# Patient Record
Sex: Male | Born: 1990 | Race: Black or African American | Hispanic: No | Marital: Single | Smoking: Former smoker
Health system: Southern US, Community
[De-identification: ages and names within clinical notes are randomized; demographics above are authoritative.]

---

## 2012-11-01 ENCOUNTER — Ambulatory Visit (INDEPENDENT_AMBULATORY_CARE_PROVIDER_SITE_OTHER): Payer: BC Managed Care – PPO | Admitting: Emergency Medicine

## 2012-11-01 VITALS — BP 122/74 | HR 60 | Temp 98.0°F | Resp 18 | Ht 70.0 in | Wt 197.0 lb

## 2012-11-01 DIAGNOSIS — S335XXA Sprain of ligaments of lumbar spine, initial encounter: Secondary | ICD-10-CM

## 2012-11-01 MED ORDER — HYDROCODONE-ACETAMINOPHEN 5-325 MG PO TABS: 1.0000 | ORAL_TABLET | ORAL | Status: DC | PRN

## 2012-11-01 MED ORDER — NAPROXEN 500 MG PO TABS: 500.0000 mg | ORAL_TABLET | Freq: Two times a day (BID) | ORAL | Status: DC

## 2012-11-01 MED ORDER — CYCLOBENZAPRINE HCL 10 MG PO TABS: 10.0000 mg | ORAL_TABLET | Freq: Three times a day (TID) | ORAL | Status: DC | PRN

## 2012-11-01 NOTE — Patient Instructions (Signed)
Back Pain, Adult Low back pain is very common. About 1 in 5 people have back pain.The cause of low back pain is rarely dangerous. The pain often gets better over time.About half of people with a sudden onset of back pain feel better in just 2 weeks. About 8 in 10 people feel better by 6 weeks.  CAUSES Some common causes of back pain include:  Strain of the muscles or ligaments supporting the spine.  Wear and tear (degeneration) of the spinal discs.  Arthritis.  Direct injury to the back. DIAGNOSIS Most of the time, the direct cause of low back pain is not known.However, back pain can be treated effectively even when the exact cause of the pain is unknown.Answering your caregiver's questions about your overall health and symptoms is one of the most accurate ways to make sure the cause of your pain is not dangerous. If your caregiver needs more information, he or she may order lab work or imaging tests (X-rays or MRIs).However, even if imaging tests show changes in your back, this usually does not require surgery. HOME CARE INSTRUCTIONS For many people, back pain returns.Since low back pain is rarely dangerous, it is often a condition that people can learn to [facility] their own.   Remain active. It is stressful on the back to sit or stand in one place. Do not sit, drive, or stand in one place for more than 30 minutes at a time. Take short walks on level surfaces as soon as pain allows.Try to increase the length of time you walk each day.  Do not stay in bed.Resting more than 1 or 2 days can delay your recovery.  Do not avoid exercise or work.Your body is made to move.It is not dangerous to be active, even though your back may hurt.Your back will likely heal faster if you return to being active before your pain is gone.  Pay attention to your body when you bend and lift. Many people have less discomfortwhen lifting if they bend their knees, keep the load close to their bodies,and  avoid twisting. Often, the most comfortable positions are those that put less stress on your recovering back.  Find a comfortable position to sleep. Use a firm mattress and lie on your side with your knees slightly bent. If you lie on your back, put a pillow under your knees.  Only take over-the-counter or prescription medicines as directed by your caregiver. Over-the-counter medicines to reduce pain and inflammation are often the most helpful.Your caregiver may prescribe muscle relaxant drugs.These medicines help dull your pain so you can more quickly return to your normal activities and healthy exercise.  Put ice on the injured area.  Put ice in a plastic bag.  Place a towel between your skin and the bag.  Leave the ice on for 15-20 minutes, 3-4 times a day for the first 2 to 3 days. After that, ice and heat may be alternated to reduce pain and spasms.  Ask your caregiver about trying back exercises and gentle massage. This may be of some benefit.  Avoid feeling anxious or stressed.Stress increases muscle tension and can worsen back pain.It is important to recognize when you are anxious or stressed and learn ways to manage it.Exercise is a great option. SEEK MEDICAL CARE IF:  You have pain that is not relieved with rest or medicine.  You have pain that does not improve in 1 week.  You have new symptoms.  You are generally not feeling well. SEEK  IMMEDIATE MEDICAL CARE IF:   You have pain that radiates from your back into your legs.  You develop new bowel or bladder control problems.  You have unusual weakness or numbness in your arms or legs.  You develop nausea or vomiting.  You develop abdominal pain.  You feel faint. Document Released: 04/03/2005 Document Revised: 10/03/2011 Document Reviewed: 08/22/2010 [facility] Patient Information 2014 [place], [place].  Thank you for enrolling in MyChart. Please follow the instructions below to securely access your online medical  record. MyChart allows you to send messages to your doctor, view your test results, manage appointments, and more.   How Do I Sign Up? 1. In your Internet browser, go to [employer] and enter [email or web address]. 2. Click on the Sign Up Now link in the Sign In box. You will see the New Member Sign Up page. 3. Enter your MyChart Access Code exactly as it appears below. You will not need to use this code after you've completed the sign-up process. If you do not sign up before the expiration date, you must request a new code. MyChart Access Code: [email or web address] Expires: 12/01/2012  3:09 PM  4. Enter your Social Security Number ([email or web address]) and Date of Birth (mm/dd/yyyy) as indicated and click Submit. You will be taken to the next sign-up page. 5. Create a MyChart ID. This will be your MyChart login ID and cannot be changed, so think of one that is secure and easy to remember. 6. Create a MyChart password. You can change your password at any time. 7. Enter your Password Reset Question and Answer. This can be used at a later time if you forget your password.  8. Enter your e-mail address. You will receive e-mail notification when new information is available in MyChart. 9. Click Sign Up. You can now view your medical record.   Additional Information Remember, MyChart is NOT to be used for urgent needs. For medical emergencies, dial 911.

## 2012-11-01 NOTE — Progress Notes (Signed)
  Subjective:    Tim Powell is a 22 y.o. male who presents for evaluation of low back pain. The patient has had no prior back problems. Symptoms have been present for several week and are stable.  Onset was related to / precipitated by a twisting movement. The pain is located in the across the lower back and does not radiate. The pain is described as aching, burning, and soreness and occurs intermittently. He rates his pain as moderate. Symptoms are exacerbated by exercise, extension, flexion, lifting, straining and twisting. Symptoms are improved by NSAIDs and rest. He has also tried nothing which provided no symptom relief. He has no other symptoms associated with the back pain. The patient has no "red flag" history indicative of complicated back pain.  The following portions of the patient's history were reviewed and updated as appropriate: allergies, current medications, past family history, past medical history, past social history, past surgical history and problem list.  Review of Systems Pertinent items are noted in HPI.    Objective:   Full range of motion without pain, no tenderness, no spasm, no curvature. Normal reflexes, gait, strength and negative straight-leg raise.    Assessment:    Nonspecific acute low back pain    Plan:    Natural history and expected course discussed. Questions answered. Agricultural engineer distributed. Proper lifting, bending technique discussed. Heat to affected area as needed for local pain relief.

## 2012-11-01 NOTE — Progress Notes (Deleted)
[facility] [address], [place] [address] [phone]  Date:  11/01/2012   Name:  Tim Powell   DOB:  Jan 25, 1991   MRN:  [id number]  PCP:  No PCP Per Patient    Chief Complaint: Back Pain   History of Present Illness:  Tim Powell is a 22 y.o. very pleasant male patient who presents with the following:  ***  There are no active problems to display for this patient.   History reviewed. No pertinent past medical history.  History reviewed. No pertinent past surgical history.  History  Substance Use Topics  . Smoking status: Current Every Day Smoker  . Smokeless tobacco: Not on file  . Alcohol Use: Yes    History reviewed. No pertinent family history.  No Known Allergies  Medication list has been reviewed and updated.  No current outpatient prescriptions on file prior to visit.   No current facility-administered medications on file prior to visit.    Review of Systems:  ***  Physical Examination: Filed Vitals:   11/01/12 1456  BP: 122/74  Pulse: 60  Temp: 98 F (36.7 C)  Resp: 18   Filed Vitals:   11/01/12 1456  Height: 5\' 10"  (1.778 m)  Weight: 197 lb (89.359 kg)   Body mass index is 28.27 kg/(m^2). Ideal Body Weight: Weight in (lb) to have BMI = 25: 173.9  ***  Assessment and Plan: *** Signed,  Phillips Odor, MD

## 2014-09-04 ENCOUNTER — Ambulatory Visit (INDEPENDENT_AMBULATORY_CARE_PROVIDER_SITE_OTHER): Payer: BLUE CROSS/BLUE SHIELD | Admitting: Physician Assistant

## 2014-09-04 ENCOUNTER — Ambulatory Visit (INDEPENDENT_AMBULATORY_CARE_PROVIDER_SITE_OTHER): Payer: BLUE CROSS/BLUE SHIELD

## 2014-09-04 VITALS — BP 108/74 | HR 53 | Temp 98.5°F | Resp 14 | Ht 71.0 in | Wt 206.0 lb

## 2014-09-04 DIAGNOSIS — M25511 Pain in right shoulder: Secondary | ICD-10-CM

## 2014-09-04 MED ORDER — MELOXICAM 15 MG PO TABS: 15.0000 mg | ORAL_TABLET | Freq: Every day | ORAL | Status: DC

## 2014-09-04 NOTE — Progress Notes (Signed)
[facility] [address], [place] [id number] [phone]  Date:  09/04/2014   Name:  Tim FurryDarryl E Wiseman Jr.   DOB:  07/16/1990   MRN:  [id number]  PCP:  No PCP Per Patient    Chief Complaint: Shoulder Pain   History of Present Illness:  Tim E Erskine SquibbHill Jr. is a 24 y.o. very pleasant male patient who presents with the following:  More than 10 days ago, he woke up with right shoulder pain.  He thought he slept on it funny.  He worked Monday through Thursday with progressively worsened pain.  He woke up with soreness along the posterior side of his shoulder.  When he internally rotates, flexes, or pushes a big door.  It is sore like a muscle ache with exercise.  He has tried ice and warm compresses, which helps.  First gets out of shower it feels better.  Worse first thing in the morning.  No numbness or tingling in the extremity.  He has some pain with the wrist with pushing off the bed.    Vacationing 3 weeks ago, there was no pain.  He came back the first day of May.    There are no active problems to display for this patient.   History reviewed. No pertinent past medical history.  History reviewed. No pertinent past surgical history.  History  Substance Use Topics  . Smoking status: Former Games developermoker  . Smokeless tobacco: Not on file  . Alcohol Use: 0.0 oz/week    0 Standard drinks or equivalent per week    Family History  Problem Relation Age of Onset  . Hypertension Mother     No Known Allergies  Medication list has been reviewed and updated.  No current outpatient prescriptions on file prior to visit.   No current facility-administered medications on file prior to visit.    Review of Systems: ROS otherwise unremarkable unless listed above.  Physical Examination: Filed Vitals:   09/04/14 1321  BP: 108/74  Pulse: 53  Temp: 98.5 F (36.9 C)  Resp: 14   Filed Vitals:   09/04/14 1321  Height: 5\' 11"  (1.803 m)  Weight: 206 lb (93.441 kg)    Body mass index is 28.74 kg/(m^2). Ideal Body Weight: Weight in (lb) to have BMI = 25: 178.9  Physical Exam  Constitutional: He is oriented to person, place, and time. He appears well-developed and well-nourished.  HENT:  Head: Normocephalic and atraumatic.  Eyes: Pupils are equal, round, and reactive to light.  Cardiovascular: Normal rate, regular rhythm and normal heart sounds.   Pulmonary/Chest: Effort normal and breath sounds normal. No respiratory distress. He has no wheezes.  Musculoskeletal:  No swelling, erythema,  -Pain with internal rotation.  Lift off (subscapularis) pain, but with normal strength throughout.  Negative Neer.  Negative Speed and Yergason. +'Obrien -Apprehension -Clunk, but does have discomfort with axial loading, with arm abducted and extended.   -Tinel and -Phalen  Neurological: He is alert and oriented to person, place, and time.  Skin: Skin is warm and dry.  Psychiatric: He has a normal mood and affect. His behavior is normal.   [facility] reading (PRIMARY) by  Dr. Neva SeatGreene: No acute findings   Assessment and Plan: 24 year old male is here today for chief complaint of right shoulder pain.   Possible rotator cuff or labrum etiology.  I have discussed Mobic without other NSAID use.  Ice, and rest for several days.  He agrees to  at least 5 days of rest.  Then start of easy stretches to do daily.  If pain does not improve after 10 days, will refer to ortho.    Right shoulder pain - Plan: DG Shoulder Right, meloxicam (MOBIC) 15 MG tablet  Trena PlattStephanie English, PA-C [facility] 5/23/20167:53 AM

## 2014-09-04 NOTE — Patient Instructions (Addendum)
Please use ice for this shoulder as you have been.  Do this 3-4 times per day for 15 minutes, making surrre there is a protective barrier between you and the ice. Take the mobic and refrain from using ibuprofen, naproxen, or other NSAIDs.  You may add tylenol if needed.  Please rest it for the next week, or at least 5 days.  Please choose 3 light exercises to do daily below.  If it asks, for repetition do it 8 times.  If there is a hold, hold for 10 seconds.  If the pain does not improve in 10 days, please contact me.  EXERCISES  RANGE OF MOTION (ROM) AND STRETCHING EXERCISES - Impingement Syndrome (Rotator Cuff  Tendinitis, Bursitis) These exercises may help you when beginning to rehabilitate your injury. Your symptoms may go away with or without further involvement from your physician, physical therapist or athletic trainer. While completing these exercises, remember:   Restoring tissue flexibility helps normal motion to return to the joints. This allows healthier, less painful movement and activity.  An effective stretch should be held for at least 30 seconds.  A stretch should never be painful. You should only feel a gentle lengthening or release in the stretched tissue. STRETCH - Flexion, Standing  Stand with good posture. With an underhand grip on your right / left hand, and an overhand grip on the opposite hand, grasp a broomstick or cane so that your hands are a little more than shoulder width apart.  Keeping your right / left elbow straight and shoulder muscles relaxed, push the stick with your opposite hand, to raise your right / left arm in front of your body and then overhead. Raise your arm until you feel a stretch in your right / left shoulder, but before you have increased shoulder pain.  Try to avoid shrugging your right / left shoulder as your arm rises, by keeping your shoulder blade tucked down and toward your mid-back spine. Hold for __________ seconds.  Slowly return to the  starting position. Repeat __________ times. Complete this exercise __________ times per day. STRETCH - Abduction, Supine  Lie on your back. With an underhand grip on your right / left hand and an overhand grip on the opposite hand, grasp a broomstick or cane so that your hands are a little more than shoulder width apart.  Keeping your right / left elbow straight and your shoulder muscles relaxed, push the stick with your opposite hand, to raise your right / left arm out to the side of your body and then overhead. Raise your arm until you feel a stretch in your right / left shoulder, but before you have increased shoulder pain.  Try to avoid shrugging your right / left shoulder as your arm rises, by keeping your shoulder blade tucked down and toward your mid-back spine. Hold for __________ seconds.  Slowly return to the starting position. Repeat __________ times. Complete this exercise __________ times per day. ROM - Flexion, Active-Assisted  Lie on your back. You may bend your knees for comfort.  Grasp a broomstick or cane so your hands are about shoulder width apart. Your right / left hand should grip the end of the stick, so that your hand is positioned "thumbs-up," as if you were about to shake hands.  Using your healthy arm to lead, raise your right / left arm overhead, until you feel a gentle stretch in your shoulder. Hold for __________ seconds.  Use the stick to assist in returning  your right / left arm to its starting position. Repeat __________ times. Complete this exercise __________ times per day.  ROM - Internal Rotation, Supine   Lie on your back on a firm surface. Place your right / left elbow about 60 degrees away from your side. Elevate your elbow with a folded towel, so that the elbow and shoulder are the same height.  Using a broomstick or cane and your strong arm, pull your right / left hand toward your body until you feel a gentle stretch, but no increase in your  shoulder pain. Keep your shoulder and elbow in place throughout the exercise.  Hold for __________ seconds. Slowly return to the starting position. Repeat __________ times. Complete this exercise __________ times per day. STRETCH - Internal Rotation  Place your right / left hand behind your back, palm up.  Throw a towel or belt over your opposite shoulder. Grasp the towel with your right / left hand.  While keeping an upright posture, gently pull up on the towel, until you feel a stretch in the front of your right / left shoulder.  Avoid shrugging your right / left shoulder as your arm rises, by keeping your shoulder blade tucked down and toward your mid-back spine.  Hold for __________ seconds. Release the stretch, by lowering your healthy hand. Repeat __________ times. Complete this exercise __________ times per day. ROM - Internal Rotation   Using an underhand grip, grasp a stick behind your back with both hands.  While standing upright with good posture, slide the stick up your back until you feel a mild stretch in the front of your shoulder.  Hold for __________ seconds. Slowly return to your starting position. Repeat __________ times. Complete this exercise __________ times per day.  STRETCH - Posterior Shoulder Capsule   Stand or sit with good posture. Grasp your right / left elbow and draw it across your chest, keeping it at the same height as your shoulder.  Pull your elbow, so your upper arm comes in closer to your chest. Pull until you feel a gentle stretch in the back of your shoulder.  Hold for __________ seconds. Repeat __________ times. Complete this exercise __________ times per day. STRENGTHENING EXERCISES - Impingement Syndrome (Rotator Cuff Tendinitis, Bursitis) These exercises may help you when beginning to rehabilitate your injury. They may resolve your symptoms with or without further involvement from your physician, physical therapist or athletic trainer. While  completing these exercises, remember:  Muscles can gain both the endurance and the strength needed for everyday activities through controlled exercises.  Complete these exercises as instructed by your physician, physical therapist or athletic trainer. Increase the resistance and repetitions only as guided.  You may experience muscle soreness or fatigue, but the pain or discomfort you are trying to eliminate should never worsen during these exercises. If this pain does get worse, stop and make sure you are following the directions exactly. If the pain is still present after adjustments, discontinue the exercise until you can discuss the trouble with your clinician.  During your recovery, avoid activity or exercises which involve actions that place your injured hand or elbow above your head or behind your back or head. These positions stress the tissues which you are trying to heal. STRENGTH - Scapular Depression and Adduction   With good posture, sit on a firm chair. Support your arms in front of you, with pillows, arm rests, or on a table top. Have your elbows in line with the sides  of your body.  Gently draw your shoulder blades down and toward your mid-back spine. Gradually increase the tension, without tensing the muscles along the top of your shoulders and the back of your neck.  Hold for __________ seconds. Slowly release the tension and relax your muscles completely before starting the next repetition.  After you have practiced this exercise, remove the arm support and complete the exercise in standing as well as sitting position. Repeat __________ times. Complete this exercise __________ times per day.  STRENGTH - Shoulder Abductors, Isometric  With good posture, stand or sit about 4-6 inches from a wall, with your right / left side facing the wall.  Bend your right / left elbow. Gently press your right / left elbow into the wall. Increase the pressure gradually, until you are pressing  as hard as you can, without shrugging your shoulder or increasing any shoulder discomfort.  Hold for __________ seconds.  Release the tension slowly. Relax your shoulder muscles completely before you begin the next repetition. Repeat __________ times. Complete this exercise __________ times per day.  STRENGTH - External Rotators, Isometric  Keep your right / left elbow at your side and bend it 90 degrees.  Step into a door frame so that the outside of your right / left wrist can press against the door frame without your upper arm leaving your side.  Gently press your right / left wrist into the door frame, as if you were trying to swing the back of your hand away from your stomach. Gradually increase the tension, until you are pressing as hard as you can, without shrugging your shoulder or increasing any shoulder discomfort.  Hold for __________ seconds.  Release the tension slowly. Relax your shoulder muscles completely before you begin the next repetition. Repeat __________ times. Complete this exercise __________ times per day.  STRENGTH - Supraspinatus   Stand or sit with good posture. Grasp a __________ weight, or an exercise band or tubing, so that your hand is "thumbs-up," like you are shaking hands.  Slowly lift your right / left arm in a "V" away from your thigh, diagonally into the space between your side and straight ahead. Lift your hand to shoulder height or as far as you can, without increasing any shoulder pain. At first, many people do not lift their hands above shoulder height.  Avoid shrugging your right / left shoulder as your arm rises, by keeping your shoulder blade tucked down and toward your mid-back spine.  Hold for __________ seconds. Control the descent of your hand, as you slowly return to your starting position. Repeat __________ times. Complete this exercise __________ times per day.  STRENGTH - External Rotators  Secure a rubber exercise band or tubing to a  fixed object (table, pole) so that it is at the same height as your right / left elbow when you are standing or sitting on a firm surface.  Stand or sit so that the secured exercise band is at your uninjured side.  Bend your right / left elbow 90 degrees. Place a folded towel or small pillow under your right / left arm, so that your elbow is a few inches away from your side.  Keeping the tension on the exercise band, pull it away from your body, as if pivoting on your elbow. Be sure to keep your body steady, so that the movement is coming only from your rotating shoulder.  Hold for __________ seconds. Release the tension in a controlled manner, as  you return to the starting position. Repeat __________ times. Complete this exercise __________ times per day.  STRENGTH - Internal Rotators   Secure a rubber exercise band or tubing to a fixed object (table, pole) so that it is at the same height as your right / left elbow when you are standing or sitting on a firm surface.  Stand or sit so that the secured exercise band is at your right / left side.  Bend your elbow 90 degrees. Place a folded towel or small pillow under your right / left arm so that your elbow is a few inches away from your side.  Keeping the tension on the exercise band, pull it across your body, toward your stomach. Be sure to keep your body steady, so that the movement is coming only from your rotating shoulder.  Hold for __________ seconds. Release the tension in a controlled manner, as you return to the starting position. Repeat __________ times. Complete this exercise __________ times per day.  STRENGTH - Scapular Protractors, Standing   Stand arms length away from a wall. Place your hands on the wall, keeping your elbows straight.  Begin by dropping your shoulder blades down and toward your mid-back spine.  To strengthen your protractors, keep your shoulder blades down, but slide them forward on your rib cage. It will  feel as if you are lifting the back of your rib cage away from the wall. This is a subtle motion and can be challenging to complete. Ask your caregiver for further instruction, if you are not sure you are doing the exercise correctly.  Hold for __________ seconds. Slowly return to the starting position, resting the muscles completely before starting the next repetition. Repeat __________ times. Complete this exercise __________ times per day. STRENGTH - Scapular Protractors, Supine  Lie on your back on a firm surface. Extend your right / left arm straight into the air while holding a __________ weight in your hand.  Keeping your head and back in place, lift your shoulder off the floor.  Hold for __________ seconds. Slowly return to the starting position, and allow your muscles to relax completely before starting the next repetition. Repeat __________ times. Complete this exercise __________ times per day. STRENGTH - Scapular Protractors, Quadruped  Get onto your hands and knees, with your shoulders directly over your hands (or as close as you can be, comfortably).  Keeping your elbows locked, lift the back of your rib cage up into your shoulder blades, so your mid-back rounds out. Keep your neck muscles relaxed.  Hold this position for __________ seconds. Slowly return to the starting position and allow your muscles to relax completely before starting the next repetition. Repeat __________ times. Complete this exercise __________ times per day.  STRENGTH - Scapular Retractors  Secure a rubber exercise band or tubing to a fixed object (table, pole), so that it is at the height of your shoulders when you are either standing, or sitting on a firm armless chair.  With a palm down grip, grasp an end of the band in each hand. Straighten your elbows and lift your hands straight in front of you, at shoulder height. Step back, away from the secured end of the band, until it becomes tense.  Squeezing  your shoulder blades together, draw your elbows back toward your sides, as you bend them. Keep your upper arms lifted away from your body throughout the exercise.  Hold for __________ seconds. Slowly ease the tension on the band, as  you reverse the directions and return to the starting position. Repeat __________ times. Complete this exercise __________ times per day. STRENGTH - Shoulder Extensors   Secure a rubber exercise band or tubing to a fixed object (table, pole) so that it is at the height of your shoulders when you are either standing, or sitting on a firm armless chair.  With a thumbs-up grip, grasp an end of the band in each hand. Straighten your elbows and lift your hands straight in front of you, at shoulder height. Step back, away from the secured end of the band, until it becomes tense.  Squeezing your shoulder blades together, pull your hands down to the sides of your thighs. Do not allow your hands to go behind you.  Hold for __________ seconds. Slowly ease the tension on the band, as you reverse the directions and return to the starting position. Repeat __________ times. Complete this exercise __________ times per day.  STRENGTH - Scapular Retractors and External Rotators   Secure a rubber exercise band or tubing to a fixed object (table, pole) so that it is at the height as your shoulders, when you are either standing, or sitting on a firm armless chair.  With a palm down grip, grasp an end of the band in each hand. Bend your elbows 90 degrees and lift your elbows to shoulder height, at your sides. Step back, away from the secured end of the band, until it becomes tense.  Squeezing your shoulder blades together, rotate your shoulders so that your upper arms and elbows remain stationary, but your fists travel upward to head height.  Hold for __________ seconds. Slowly ease the tension on the band, as you reverse the directions and return to the starting position. Repeat  __________ times. Complete this exercise __________ times per day.  STRENGTH - Scapular Retractors and External Rotators, Rowing   Secure a rubber exercise band or tubing to a fixed object (table, pole) so that it is at the height of your shoulders, when you are either standing, or sitting on a firm armless chair.  With a palm down grip, grasp an end of the band in each hand. Straighten your elbows and lift your hands straight in front of you, at shoulder height. Step back, away from the secured end of the band, until it becomes tense.  Step 1: Squeeze your shoulder blades together. Bending your elbows, draw your hands to your chest, as if you are rowing a boat. At the end of this motion, your hands and elbow should be at shoulder height and your elbows should be out to your sides.  Step 2: Rotate your shoulders, to raise your hands above your head. Your forearms should be vertical and your upper arms should be horizontal.  Hold for __________ seconds. Slowly ease the tension on the band, as you reverse the directions and return to the starting position. Repeat __________ times. Complete this exercise __________ times per day.  STRENGTH - Scapular Depressors  Find a sturdy chair without wheels, such as a dining room chair.  Keeping your feet on the floor, and your hands on the chair arms, lift your bottom up from the seat, and lock your elbows.  Keeping your elbows straight, allow gravity to pull your body weight down. Your shoulders will rise toward your ears.  Raise your body against gravity by drawing your shoulder blades down your back, shortening the distance between your shoulders and ears. Although your feet should always maintain contact with  the floor, your feet should progressively support less body weight, as you get stronger.  Hold for __________ seconds. In a controlled and slow manner, lower your body weight to begin the next repetition. Repeat __________ times. Complete this  exercise __________ times per day.  Document Released: 04/03/2005 Document Revised: 06/26/2011 Document Reviewed: 07/16/2008 [facility] Patient Information 2015 [place], [place]. This information is not intended to replace advice given to you by your health care provider. Make sure you discuss any questions you have with your health care provider.

## 2014-09-21 ENCOUNTER — Ambulatory Visit (INDEPENDENT_AMBULATORY_CARE_PROVIDER_SITE_OTHER): Payer: BLUE CROSS/BLUE SHIELD | Admitting: Physician Assistant

## 2014-09-21 VITALS — BP 132/80 | HR 61 | Temp 98.3°F | Resp 17 | Ht 71.0 in | Wt 205.0 lb

## 2014-09-21 DIAGNOSIS — M25511 Pain in right shoulder: Secondary | ICD-10-CM

## 2014-09-21 NOTE — Patient Instructions (Signed)
I have filled out your paperwork to bring to your employer, or whomever.  Please await phone call for referral to ortho appointment. Continue take an anti-inflammatory, ice, and do the light stretches.

## 2014-09-28 ENCOUNTER — Encounter: Payer: Self-pay | Admitting: Physician Assistant

## 2014-09-28 NOTE — Progress Notes (Signed)
[facility] [address] [place] [address] [phone]  Date:  09/21/2014   Name:  Tim Powell.   DOB:  22-Mar-1991   MRN:  [id number]  PCP:  No PCP Per Patient    History of Present Illness:  Tim E Majestic Spessard. is a 24 y.o. male patient who present to [facility] for followup of right shoulder injury and to have paperwork filled out.  He is doing much better, he reports.  He states that he has been off work since 08/31/2014.  He is compliantly doing stretches every day, ice, and medications.  He has not taken the mobic lately.  He states that he continues to have some shoulder pain lifting from behind.  He has not tingling or paresthesias of the extremity.      There are no active problems to display for this patient.   History reviewed. No pertinent past medical history.  History reviewed. No pertinent past surgical history.  History  Substance Use Topics  . Smoking status: Former Games developer  . Smokeless tobacco: Not on file  . Alcohol Use: 0.0 oz/week    0 Standard drinks or equivalent per week    Family History  Problem Relation Age of Onset  . Hypertension Mother     No Known Allergies  Medication list has been reviewed and updated.  Current Outpatient Prescriptions on File Prior to Visit  Medication Sig Dispense Refill  . meloxicam (MOBIC) 15 MG tablet Take 1 tablet (15 mg total) by mouth daily. (Patient not taking: Reported on 09/21/2014) 30 tablet 1   No current facility-administered medications on file prior to visit.    ROS   Physical Examination: BP 132/80 mmHg  Pulse 61  Temp(Src) 98.3 F (36.8 C) (Oral)  Resp 17  Ht 5\' 11"  (1.803 m)  Wt 205 lb (92.987 kg)  BMI 28.60 kg/m2  SpO2 99% Ideal Body Weight: Weight in (lb) to have BMI = 25: 178.9  Physical Exam  Constitutional: He appears well-developed and well-nourished.  Cardiovascular: Normal rate, regular rhythm and normal heart sounds.   Pulmonary/Chest: Effort normal and breath  sounds normal. No respiratory distress. He has no wheezes.  Musculoskeletal:       Right shoulder: He exhibits pain (With internal rotation and vertical extension). He exhibits normal range of motion, no bony tenderness, no swelling, no spasm, normal pulse and normal strength.     Assessment and Plan: 24 year old male is here today for follow up and paperwork for right shoulder injury.  This is improving.  I have filled the paperwork to allow time for injury resolve.  Will also place referral to orthopedics.  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  Estimated rt to work for 10/16/2014.    1. Right shoulder pain - AMB referral to orthopedics   Trena Platt, PA-C [facility] 09/28/2014 8:58 AM

## 2014-10-22 ENCOUNTER — Telehealth: Payer: Self-pay | Admitting: Physician Assistant

## 2014-10-22 NOTE — Telephone Encounter (Signed)
Assessment and Plan: 24 year old male is here today for follow up and paperwork for right shoulder injury. This is improving. I have filled the paperwork to allow time for injury resolve. Will also place referral to orthopedics. 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 Estimated rt to work for 10/16/2014.   It looks like Judeth CornfieldStephanie already filled out this information as far as RTW. Called pt, he needs a note to go back to work. He states he is feeling better. Should he RTC?

## 2014-10-22 NOTE — Telephone Encounter (Signed)
Tim Powell, can tell him to contact ortho to his disability forms too?

## 2014-10-22 NOTE — Telephone Encounter (Signed)
He really should get a recheck before return to work. Looks like he was referred to ortho at last visit. They are now in charge of this - he needs to contact them to get a RTW note.

## 2014-10-22 NOTE — Telephone Encounter (Signed)
Patient states that he needs a note to return to work. He was last seen on 09/21/2014. He also states that he has some disability paperwork that needs to be completed. Explained out policy on that. Please advise patient if we are able to do his RTW note or if he needs a recheck first.  [phone]

## 2014-10-22 NOTE — Telephone Encounter (Signed)
Spoke with pt, he stated he was never called for a referral to ortho so he did not go. I just advised him to RTC to sort all of this out.

## 2014-11-05 DIAGNOSIS — Z0271 Encounter for disability determination: Secondary | ICD-10-CM

## 2014-11-05 NOTE — Telephone Encounter (Signed)
Paperwork completed and put in drop box at 102, called patient to him know it was ready for pick up.

## 2014-11-25 ENCOUNTER — Other Ambulatory Visit: Payer: Self-pay | Admitting: Family Medicine

## 2014-11-25 DIAGNOSIS — M25511 Pain in right shoulder: Secondary | ICD-10-CM

## 2014-11-29 ENCOUNTER — Other Ambulatory Visit: Payer: Self-pay

## 2018-01-01 ENCOUNTER — Other Ambulatory Visit: Payer: Self-pay

## 2018-01-01 ENCOUNTER — Encounter: Payer: Self-pay | Admitting: Urgent Care

## 2018-01-01 ENCOUNTER — Ambulatory Visit (INDEPENDENT_AMBULATORY_CARE_PROVIDER_SITE_OTHER): Payer: BLUE CROSS/BLUE SHIELD

## 2018-01-01 ENCOUNTER — Ambulatory Visit: Payer: BLUE CROSS/BLUE SHIELD | Admitting: Urgent Care

## 2018-01-01 VITALS — BP 127/82 | HR 58 | Temp 98.4°F | Ht 71.5 in | Wt 219.6 lb

## 2018-01-01 DIAGNOSIS — M25511 Pain in right shoulder: Secondary | ICD-10-CM | POA: Diagnosis not present

## 2018-01-01 MED ORDER — PREDNISONE 20 MG PO TABS: ORAL_TABLET | ORAL | 0 refills | Status: DC

## 2018-01-01 MED ORDER — MELOXICAM 7.5 MG PO TABS: 15.0000 mg | ORAL_TABLET | Freq: Every day | ORAL | 1 refills | Status: DC

## 2018-01-01 MED ORDER — CYCLOBENZAPRINE HCL 5 MG PO TABS: 5.0000 mg | ORAL_TABLET | Freq: Every evening | ORAL | 1 refills | Status: DC | PRN

## 2018-01-01 NOTE — Progress Notes (Signed)
    MRN: [id number] DOB: 03/16/1991  Subjective:   Tim E Erskine SquibbHill Jr. is a 27 y.o. male presenting for 1 week history of right shoulder pain. His pain is intermittent, dull/throbbing, occurs randomly, lasts about an hour. Patient has strenuous work, does a lot of lifting, extending of arms. Patient was previously seen for this in May and June 2016 and was written out of work indefinitely at his request.  He was supposed to be seen by specialist, he did end up going and was supposed to have an MRI the patient refused this due to feelings of claustrophobia.  Denies any particular trauma, falls, sport injury, bruise, redness, swelling, warmth. Has tried Bayer aspirin with minimal relief.  reports that he has quit smoking. He has never used smokeless tobacco. He reports that he drinks alcohol. He reports that he has current or past drug history. Drug: Marijuana.   Mitsuo has a current medication list which includes the following prescription(s): meloxicam. Also has No Known Allergies.  Tim Powell  has no past medical history on file. Also  has no past surgical history on file.  Objective:   Vitals: BP 127/82 (BP Location: Right Arm, Patient Position: Sitting, Cuff Size: Normal)   Pulse (!) 58   Temp 98.4 F (36.9 C) (Oral)   Ht 5' 11.5" (1.816 m)   Wt 219 lb 9.6 oz (99.6 kg)   SpO2 100%   BMI 30.20 kg/m   Physical Exam  Musculoskeletal:       Right shoulder: He exhibits decreased range of motion (external rotation) and tenderness (during ROM testing, over anterior-lateral deltoids). He exhibits no bony tenderness, no swelling, no effusion, no crepitus, no laceration, no spasm and normal strength.  Negative Hawkins, Neer and empty beer can test.   Dg Shoulder Right  Result Date: 01/01/2018 CLINICAL DATA:  Acute pain right shoulder. EXAM: RIGHT SHOULDER - 2+ VIEW COMPARISON:  No recent. FINDINGS: No acute bony or joint abnormality identified. No evidence of fracture or dislocation. IMPRESSION: No  acute abnormality. Electronically Signed   By: Maisie Fushomas  Register   On: 01/01/2018 15:28     Assessment and Plan :   Acute pain of right shoulder - Plan: Ambulatory referral to Orthopedic Surgery, DG Shoulder Right  Right shoulder pain - Plan: meloxicam (MOBIC) 7.5 MG tablet, DG Shoulder Right  I refused to write patient out of work indefinitely given that his physical exam findings were fairly benign.  I did write patient for light duty work restrictions.  Referral to orthopedics is pending.  In the meantime patient is to use short steroid course, muscle relaxant.  Once he is done with prednisone, patient is to use meloxicam. Counseled patient on potential for adverse effects with medications prescribed today, patient verbalized understanding. Return-to-clinic precautions discussed, patient verbalized understanding.   Tim BambergMario Jenetta Wease, PA-C Primary Care at [facility] [phone] 01/01/2018  2:51 PM

## 2018-01-01 NOTE — Patient Instructions (Addendum)
Shoulder Pain Many things can cause shoulder pain, including:  An injury to the area.  Overuse of the shoulder.  Arthritis.  The source of the pain can be:  Inflammation.  An injury to the shoulder joint.  An injury to a tendon, ligament, or bone.  Follow these instructions at home: Take these actions to help with your pain:  Squeeze a soft ball or a foam pad as much as possible. This helps to keep the shoulder from swelling. It also helps to strengthen the arm.  Take over-the-counter and prescription medicines only as told by your health care provider.  If directed, apply ice to the area: ? Put ice in a plastic bag. ? Place a towel between your skin and the bag. ? Leave the ice on for 20 minutes, 2-3 times per day. Stop applying ice if it does not help with the pain.  If you were given a shoulder sling or immobilizer: ? Wear it as told. ? Remove it to shower or bathe. ? Move your arm as little as possible, but keep your hand moving to prevent swelling.  Contact a health care provider if:  Your pain gets worse.  Your pain is not relieved with medicines.  New pain develops in your arm, hand, or fingers. Get help right away if:  Your arm, hand, or fingers: ? Tingle. ? Become numb. ? Become swollen. ? Become painful. ? Turn white or blue. This information is not intended to replace advice given to you by your health care provider. Make sure you discuss any questions you have with your health care provider. Document Released: 01/11/2005 Document Revised: 11/28/2015 Document Reviewed: 07/27/2014 Elsevier Interactive Patient Education  [employer]2018 Elsevier Inc.     If you have lab work done today you will be contacted with your lab results within the next 2 weeks.  If you have not heard from us then please contact us. The fastest way to get your results is to register for My Chart.   IF you received an x-ray today, you will receive an invoice from [facility].  Please contact [facility] at [phone] with questions or concerns regarding your invoice.   IF you received labwork today, you will receive an invoice from [facility]. Please contact [facility] at [phone] with questions or concerns regarding your invoice.   Our billing staff will not be able to assist you with questions regarding bills from these companies.  You will be contacted with the lab results as soon as they are available. The fastest way to get your results is to activate your My Chart account. Instructions are located on the last page of this paperwork. If you have not heard from us regarding the results in 2 weeks, please contact this office.

## 2018-01-08 ENCOUNTER — Ambulatory Visit (INDEPENDENT_AMBULATORY_CARE_PROVIDER_SITE_OTHER): Payer: BLUE CROSS/BLUE SHIELD | Admitting: Orthopaedic Surgery

## 2018-01-08 ENCOUNTER — Encounter (INDEPENDENT_AMBULATORY_CARE_PROVIDER_SITE_OTHER): Payer: Self-pay | Admitting: Orthopaedic Surgery

## 2018-01-08 DIAGNOSIS — M25511 Pain in right shoulder: Secondary | ICD-10-CM

## 2018-01-08 NOTE — Progress Notes (Signed)
   Office Visit Note   Patient: Tim FurryDarryl E Brew Jr.           Date of Birth: 09/16/1990           MRN: [id number] Visit Date: 01/08/2018              Requested by: Wallis BambergMani, Mario, PA-C [address] [place], [place] [id number] PCP: Patient, No Pcp Per   Assessment & Plan: Visit Diagnoses:  1. Acute pain of right shoulder     Plan: Impression is rotator cuff tendinitis.  Likely from overuse.  I recommend rest and work restrictions for 6 weeks.  Patient instructed to follow-up if he has no better after that time.  Overall patient is not in significant pain to warrant a cortisone injection.  Otherwise follow-up as needed.  Follow-Up Instructions: Return if symptoms worsen or fail to improve.   Orders:  No orders of the defined types were placed in this encounter.  No orders of the defined types were placed in this encounter.     Procedures: No procedures performed   Clinical Data: No additional findings.   Subjective: Chief Complaint  Patient presents with  . Right Shoulder - Pain    Tim Powell is a 27 year old gentleman with 2 to 3 weeks of right shoulder pain that has gotten worse.  He works at [employer] and he [employer] packages.  He lifts up to 70 pounds.  He denies any acute injury or pain or any popping.  He is right-hand dominant.  Denies any neck pain or radicular symptoms.  Rest helps.   Review of Systems  Constitutional: Negative.   All other systems reviewed and are negative.    Objective: Vital Signs: There were no vitals taken for this visit.  Physical Exam  Constitutional: He is oriented to person, place, and time. He appears well-developed and well-nourished.  HENT:  Head: Normocephalic and atraumatic.  Eyes: Pupils are equal, round, and reactive to light.  Neck: Neck supple.  Pulmonary/Chest: Effort normal.  Abdominal: Soft.  Musculoskeletal: Normal range of motion.  Neurological: He is alert and oriented to person, place, and time.  Skin: Skin is warm.  Psychiatric:  He has a normal mood and affect. His behavior is normal. Judgment and thought content normal.  Nursing note and vitals reviewed.   Ortho Exam Right shoulder exam shows full active and passive range of motion without pain.  Negative impingement signs.  He has minimal pain with empty can testing.  Negative crank test. Specialty Comments:  No specialty comments available.  Imaging: No results found.   PMFS History: There are no active problems to display for this patient.  No past medical history on file.  Family History  Problem Relation Age of Onset  . Hypertension Mother     No past surgical history on file. Social History   Occupational History  . Not on file  Tobacco Use  . Smoking status: Former Games developermoker  . Smokeless tobacco: Never Used  Substance and Sexual Activity  . Alcohol use: Yes    Alcohol/week: 0.0 standard drinks  . Drug use: Yes    Types: Marijuana  . Sexual activity: Not on file

## 2018-01-16 ENCOUNTER — Telehealth: Payer: Self-pay | Admitting: Family Medicine

## 2018-01-16 NOTE — Telephone Encounter (Signed)
Patient needs disability forms completed for when he saw [facility] for his right shoulder injury. I have completed the forms based off the OV notes and highlighted what needs to be completed. I will place the forms in Dr Creta Levin box on 01/16/18 to be signed since she is covering his box. Please return to the FMLA/Disability at the 102 checkout desk within 5-7 business days. Thank you!

## 2018-01-18 ENCOUNTER — Ambulatory Visit: Payer: BLUE CROSS/BLUE SHIELD | Admitting: Family Medicine

## 2018-01-22 ENCOUNTER — Telehealth: Payer: Self-pay

## 2018-01-22 NOTE — Telephone Encounter (Signed)
Pt picked his paperwork and came back stating he needed two things changed. 1-section 2, he states his disability was 9/11 and the paperwork says 9/10. 2-Actual return to work date we have 01/02/18, it should be (according to the work note) 9/24 + 6 weeks. I have placed in your box at 102. Please advise at [phone] when ready.

## 2018-01-22 NOTE — Telephone Encounter (Signed)
Spoke with pt via phone advised [employer] STD form completed and ready for pickup.  Pt agreeable.  Copy of form placed in scan box for scanning and I have copy with me just in case it is needed before it is scanned in Epic. Dgaddy, CMA

## 2018-01-23 NOTE — Telephone Encounter (Signed)
Out of work note that was brought in by patient was from a [place] [facility] putting him out of work from 6 weeks after 9-24 not from [place]. So we can only complete forms for what we took the patient out of work for. [facility] [facility] will have to complete forms for their office.

## 2018-02-19 ENCOUNTER — Telehealth (INDEPENDENT_AMBULATORY_CARE_PROVIDER_SITE_OTHER): Payer: Self-pay | Admitting: Orthopaedic Surgery

## 2018-02-19 NOTE — Telephone Encounter (Signed)
Returned call to patient got recording mailbox have not been set up yet    Could not leave message  [phone]

## 2018-03-01 ENCOUNTER — Encounter (INDEPENDENT_AMBULATORY_CARE_PROVIDER_SITE_OTHER): Payer: Self-pay | Admitting: Orthopaedic Surgery

## 2018-03-01 ENCOUNTER — Ambulatory Visit (INDEPENDENT_AMBULATORY_CARE_PROVIDER_SITE_OTHER): Payer: BLUE CROSS/BLUE SHIELD | Admitting: Orthopaedic Surgery

## 2018-03-01 ENCOUNTER — Other Ambulatory Visit (INDEPENDENT_AMBULATORY_CARE_PROVIDER_SITE_OTHER): Payer: Self-pay

## 2018-03-01 VITALS — Ht 71.0 in | Wt 219.0 lb

## 2018-03-01 DIAGNOSIS — M25511 Pain in right shoulder: Secondary | ICD-10-CM

## 2018-03-01 DIAGNOSIS — G8929 Other chronic pain: Secondary | ICD-10-CM | POA: Diagnosis not present

## 2018-03-01 MED ORDER — LIDOCAINE HCL 2 % IJ SOLN
2.0000 mL | INTRAMUSCULAR | Status: AC | PRN
  Administered 2018-03-01: 2 mL

## 2018-03-01 MED ORDER — METHYLPREDNISOLONE ACETATE 40 MG/ML IJ SUSP
40.0000 mg | INTRAMUSCULAR | Status: AC | PRN
  Administered 2018-03-01: 40 mg via INTRA_ARTICULAR

## 2018-03-01 MED ORDER — BUPIVACAINE HCL 0.25 % IJ SOLN
2.0000 mL | INTRAMUSCULAR | Status: AC | PRN
  Administered 2018-03-01: 2 mL via INTRA_ARTICULAR

## 2018-03-01 NOTE — Progress Notes (Signed)
   Office Visit Note   Patient: Tim FurryDarryl E Pope Jr.           Date of Birth: 11/20/1990           MRN: [id number] Visit Date: 03/01/2018              Requested by: No referring provider defined for this encounter. PCP: Patient, No Pcp Per   Assessment & Plan: Visit Diagnoses:  1. Chronic right shoulder pain     Plan: Impression is right shoulder subacromial bursitis rotator cuff tendinitis.  Today, we will inject the right subacromial space with cortisone.  I will also send him to formal physical therapy to regain strength.  He will follow-up with us in 2 weeks time for recheck and hopeful release back to work at that point.  Follow-Up Instructions: Return in about 2 weeks (around 03/15/2018).   Orders:  Orders Placed This Encounter  Procedures  . Large Joint Inj: R subacromial bursa   No orders of the defined types were placed in this encounter.     Procedures: Large Joint Inj: R subacromial bursa on 03/01/2018 11:07 AM Indications: pain Details: 22 G needle Medications: 2 mL lidocaine 2 %; 2 mL bupivacaine 0.25 %; 40 mg methylPREDNISolone acetate 40 MG/ML Outcome: tolerated well, no immediate complications Patient was prepped and draped in the usual sterile fashion.       Clinical Data: No additional findings.   Subjective: Chief Complaint  Patient presents with  . Right Shoulder - Pain    HPI patient is a pleasant 27 year old gentleman who presents to our clinic today for follow-up of his right shoulder pain.  This has been ongoing for a little over a month now.  The pain has somewhat improved with rest.  No previous physical therapy or cortisone injections.  He does note that he still has some limitations with internal rotation as well as if he were to have to lift something heavy overhead.  He does work for [employer] and is concerned that this will limit his ability to work.  Review of Systems as detailed in HPI.  All others reviewed and are  negative.   Objective: Vital Signs: Ht 5\' 11"  (1.803 m)   Wt 219 lb (99.3 kg)   BMI 30.54 kg/m   Physical Exam well-developed well-nourished gentleman in no acute distress.  Alert and oriented x3.  Ortho Exam examination of his right shoulder reveals full forward flexion and abduction.  He can internally rotate to L5 and this is with slight pain.  Minimally positive empty can.  He is neurovascularly intact distally.  Specialty Comments:  No specialty comments available.  Imaging: No new imaging   PMFS History: Patient Active Problem List   Diagnosis Date Noted  . Chronic right shoulder pain 03/01/2018   No past medical history on file.  Family History  Problem Relation Age of Onset  . Hypertension Mother     No past surgical history on file. Social History   Occupational History  . Not on file  Tobacco Use  . Smoking status: Former Games developermoker  . Smokeless tobacco: Never Used  Substance and Sexual Activity  . Alcohol use: Yes    Alcohol/week: 0.0 standard drinks  . Drug use: Yes    Types: Marijuana  . Sexual activity: Not on file

## 2018-03-19 ENCOUNTER — Encounter (INDEPENDENT_AMBULATORY_CARE_PROVIDER_SITE_OTHER): Payer: Self-pay | Admitting: Orthopaedic Surgery

## 2018-03-19 ENCOUNTER — Ambulatory Visit (INDEPENDENT_AMBULATORY_CARE_PROVIDER_SITE_OTHER): Payer: BLUE CROSS/BLUE SHIELD | Admitting: Orthopaedic Surgery

## 2018-03-19 DIAGNOSIS — M25511 Pain in right shoulder: Secondary | ICD-10-CM

## 2018-03-19 NOTE — Progress Notes (Signed)
   Office Visit Note   Patient: Tim FurryDarryl E Catalina Jr.           Date of Birth: 03/31/1991           MRN: [id number] Visit Date: 03/19/2018              Requested by: No referring provider defined for this encounter. PCP: Patient, No Pcp Per   Assessment & Plan: Visit Diagnoses:  1. Acute pain of right shoulder     Plan: Patient is pain-free following subacromial steroid injection.  He is no pain with range of motion and denies any notable functional limitations.  His exam is unremarkable.  He will be cleared to return to work.  He will follow-up as needed  Follow-Up Instructions: No follow-ups on file.   Orders:  No orders of the defined types were placed in this encounter.  No orders of the defined types were placed in this encounter.     Procedures: No procedures performed   Clinical Data: No additional findings.   Subjective: Chief Complaint  Patient presents with  . Right Shoulder - Follow-up    HPI  27 year old male with right shoulder pain here for 2-week follow-up.  He received a subacromial steroid injection 03/01/2018.  Today he reports no continued pain.  He is able to perform full overhead range of motion with no discomfort.  He denies any new symptoms.  He denies any noticeable weakness or functional limitations.  He feels that he is able to return to full duty at work.  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 .  Review of Systems No pain, swelling, numbness tingling, weakness, or skin changes   Objective: Vital Signs: There were no vitals taken for this visit.  Physical Exam  GEN: Awake, alert, no acute distress Pulmonary: Breathing unlabored  Ortho Exam  right shoulder: No obvious deformity No TTP Full ROM in flexion, abduction, internal/external rotation without pain NV intact distally Special Tests:  - Impingement: Neg Hawkins and Neers.  - Supraspinatous: Negative empty can.  5/5 strength - Infraspinatous/Teres: 5/5 strength with ER -  Subscapularis: 5/5 strength with IR - AC Joint: Negative cross arm   Specialty Comments:  No specialty comments available.  Imaging: No results found.   PMFS History: Patient Active Problem List   Diagnosis Date Noted  . Chronic right shoulder pain 03/01/2018   History reviewed. No pertinent past medical history.  Family History  Problem Relation Age of Onset  . Hypertension Mother     History reviewed. No pertinent surgical history. Social History   Occupational History  . Not on file  Tobacco Use  . Smoking status: Former Games developermoker  . Smokeless tobacco: Never Used  Substance and Sexual Activity  . Alcohol use: Yes    Alcohol/week: 0.0 standard drinks  . Drug use: Yes    Types: Marijuana  . Sexual activity: Not on file

## 2018-03-25 ENCOUNTER — Ambulatory Visit: Payer: BLUE CROSS/BLUE SHIELD | Admitting: Family Medicine

## 2018-03-26 ENCOUNTER — Encounter (INDEPENDENT_AMBULATORY_CARE_PROVIDER_SITE_OTHER): Payer: Self-pay | Admitting: Orthopaedic Surgery

## 2018-03-26 ENCOUNTER — Ambulatory Visit (INDEPENDENT_AMBULATORY_CARE_PROVIDER_SITE_OTHER): Payer: BLUE CROSS/BLUE SHIELD | Admitting: Orthopaedic Surgery

## 2018-03-26 DIAGNOSIS — M25511 Pain in right shoulder: Secondary | ICD-10-CM | POA: Diagnosis not present

## 2018-03-26 DIAGNOSIS — G8929 Other chronic pain: Secondary | ICD-10-CM

## 2018-03-26 NOTE — Progress Notes (Signed)
   Office Visit Note   Patient: Tim FurryDarryl E Michelin Jr.           Date of Birth: 01/20/1991           MRN: [id number] Visit Date: 03/26/2018              Requested by: No referring provider defined for this encounter. PCP: Patient, No Pcp Per   Assessment & Plan: Visit Diagnoses:  1. Chronic right shoulder pain   2. Right shoulder pain, unspecified chronicity     Plan: Impression is right shoulder rotator cuff tendinitis.  We will go ahead and obtain an MRI of the right shoulder to further assess his rotator cuff.  He will follow-up with us once that has been completed.  In the meantime, we will provide the patient with a work note of no lifting with the right upper extremity.  Follow-Up Instructions: Return in about 2 weeks (around 04/09/2018) for MRI review right shoulder.   Orders:  Orders Placed This Encounter  Procedures  . MR SHOULDER RIGHT WO CONTRAST   No orders of the defined types were placed in this encounter.     Procedures: No procedures performed   Clinical Data: No additional findings.   Subjective: Chief Complaint  Patient presents with  . Right Shoulder - Pain    HPI patient is a pleasant 27 year old gentleman who presents to our clinic today for follow-up of his right shoulder.  He has had several months of pain following repetitive work.  He is employed by [employer] and does a lot of heavy lifting up to 70 pounds.  He was seen in our office on 03/01/2018 where he received a subacromial cortisone injection.  This significantly helped, but only lasted a few days until he returned to work.  His pain returned and is worsened with overhead lifting greater than 30 pounds.  He was unable to attend formal physical therapy due to financial constraints.  He does have an upcoming session scheduled for this coming Monday but is unsure whether he can continually attend.  No previous MRI.  Review of Systems as detailed in HPI.  All others reviewed and are  negative.   Objective: Vital Signs: There were no vitals taken for this visit.  Physical Exam well-developed well-nourished gentleman in no acute distress.  Alert and oriented x3.  Ortho Exam examination of the right shoulder reveals full active range of motion all planes.  Positive empty can.  Negative drop arm and negative cross body adduction.  Full strength throughout.  He is neurovascular intact distally.  Specialty Comments:  No specialty comments available.  Imaging: No new imaging   PMFS History: Patient Active Problem List   Diagnosis Date Noted  . Right shoulder pain 03/26/2018  . Chronic right shoulder pain 03/01/2018   History reviewed. No pertinent past medical history.  Family History  Problem Relation Age of Onset  . Hypertension Mother     History reviewed. No pertinent surgical history. Social History   Occupational History  . Not on file  Tobacco Use  . Smoking status: Former Games developermoker  . Smokeless tobacco: Never Used  Substance and Sexual Activity  . Alcohol use: Yes    Alcohol/week: 0.0 standard drinks  . Drug use: Yes    Types: Marijuana  . Sexual activity: Not on file

## 2018-04-09 ENCOUNTER — Ambulatory Visit
Admission: RE | Admit: 2018-04-09 | Discharge: 2018-04-09 | Disposition: A | Discharge status: Home / Self Care | Payer: BLUE CROSS/BLUE SHIELD | Source: Ambulatory Visit | Attending: Orthopaedic Surgery | Admitting: Orthopaedic Surgery

## 2018-04-09 ENCOUNTER — Other Ambulatory Visit: Payer: Self-pay

## 2018-04-09 DIAGNOSIS — M25511 Pain in right shoulder: Secondary | ICD-10-CM

## 2018-04-11 ENCOUNTER — Telehealth (INDEPENDENT_AMBULATORY_CARE_PROVIDER_SITE_OTHER): Payer: Self-pay | Admitting: Orthopaedic Surgery

## 2018-04-11 ENCOUNTER — Other Ambulatory Visit (INDEPENDENT_AMBULATORY_CARE_PROVIDER_SITE_OTHER): Payer: Self-pay

## 2018-04-11 MED ORDER — DIAZEPAM 5 MG PO TABS: ORAL_TABLET | ORAL | 0 refills | Status: AC

## 2018-04-11 NOTE — Telephone Encounter (Signed)
Called Rx into pharm. Patient aware.  

## 2018-04-11 NOTE — Telephone Encounter (Signed)
5 mg valium

## 2018-04-11 NOTE — Telephone Encounter (Signed)
See message below °

## 2018-04-11 NOTE — Telephone Encounter (Signed)
Patient called stating that he was unable to do the MRI due to being claustrophobic and wanted to know if Dr. Roda ShuttersXu would send him in a RX for something to relax him.  Patient uses [facility] on [address]  CB#[phone].  Thank you.

## 2018-04-19 ENCOUNTER — Telehealth (INDEPENDENT_AMBULATORY_CARE_PROVIDER_SITE_OTHER): Payer: Self-pay | Admitting: Orthopaedic Surgery

## 2018-04-19 NOTE — Telephone Encounter (Signed)
Did you call patient regarding forms?

## 2018-04-19 NOTE — Telephone Encounter (Signed)
Yes I did, I told him to come to my office at [address] [place].

## 2018-04-19 NOTE — Telephone Encounter (Signed)
Patient called stating he was told yesterday to come pickup his disability papers and complete the section he needed to fill out. Patient said he was told the form would be at the front desk for pickup today. The number to contact patient is [phone]

## 2018-04-21 ENCOUNTER — Ambulatory Visit
Admission: RE | Admit: 2018-04-21 | Discharge: 2018-04-21 | Disposition: A | Discharge status: Home / Self Care | Payer: BLUE CROSS/BLUE SHIELD | Source: Ambulatory Visit | Attending: Orthopaedic Surgery | Admitting: Orthopaedic Surgery

## 2018-04-23 ENCOUNTER — Ambulatory Visit (INDEPENDENT_AMBULATORY_CARE_PROVIDER_SITE_OTHER): Payer: BLUE CROSS/BLUE SHIELD | Admitting: Orthopaedic Surgery

## 2018-04-25 ENCOUNTER — Encounter (INDEPENDENT_AMBULATORY_CARE_PROVIDER_SITE_OTHER): Payer: Self-pay | Admitting: Orthopaedic Surgery

## 2018-04-25 ENCOUNTER — Ambulatory Visit (INDEPENDENT_AMBULATORY_CARE_PROVIDER_SITE_OTHER): Payer: BLUE CROSS/BLUE SHIELD | Admitting: Orthopaedic Surgery

## 2018-04-25 DIAGNOSIS — M67911 Unspecified disorder of synovium and tendon, right shoulder: Secondary | ICD-10-CM | POA: Diagnosis not present

## 2018-04-25 NOTE — Progress Notes (Signed)
   Office Visit Note   Patient: Tanna FurryDarryl E Jourdan Jr.           Date of Birth: 08/06/1990           MRN: [id number] Visit Date: 04/25/2018              Requested by: No referring provider defined for this encounter. PCP: Patient, No Pcp Per   Assessment & Plan: Visit Diagnoses:  1. Tendinopathy of right rotator cuff     Plan: Impression is right shoulder rotator cuff tendinopathy.  At this point, there is nothing structural that warrant surgical intervention.  I did offer the patient a repeat cortisone injection.  He has politely declined.  I encouraged him to attend formal physical therapy as I think this will help with shoulder pain.  He may return to work full duty without restrictions today.  Work note was provided today.  Follow-up with us as needed.  Follow-Up Instructions: Return if symptoms worsen or fail to improve.   Orders:  No orders of the defined types were placed in this encounter.  No orders of the defined types were placed in this encounter.     Procedures: No procedures performed   Clinical Data: No additional findings.   Subjective: Chief Complaint  Patient presents with  . Right Shoulder - Pain, Follow-up    HPI patient is a pleasant 28 year old [employer] worker who presents our clinic today discuss MRI results of the right shoulder.  History of right shoulder pain for the past several months.  Previous subacromial cortisone injection gave significant relief but only lasted a week.  Further work-up to include MRI was obtained.  This showed mild supraspinatus and infraspinatus tendinopathy without tear.  Otherwise negative study.  The patient was sent to physical therapy a while back but is only attended 1 session due to financial constraints.  Of note, his job does entail repetitive heavy lifting of up to 70 pounds for approximately 5 hours at a time on a daily basis.  Review of Systems as detailed in HPI.  All others reviewed and are  negative.   Objective: Vital Signs: There were no vitals taken for this visit.  Physical Exam well-developed and well-nourished gentleman in no acute distress.  Alert and oriented x3.  Ortho Exam stable exam of the right shoulder.  Specialty Comments:  No specialty comments available.  Imaging: No new imaging   PMFS History: Patient Active Problem List   Diagnosis Date Noted  . Tendinopathy of right rotator cuff 04/25/2018  . Right shoulder pain 03/26/2018  . Chronic right shoulder pain 03/01/2018   History reviewed. No pertinent past medical history.  Family History  Problem Relation Age of Onset  . Hypertension Mother     History reviewed. No pertinent surgical history. Social History   Occupational History  . Not on file  Tobacco Use  . Smoking status: Former Games developermoker  . Smokeless tobacco: Never Used  Substance and Sexual Activity  . Alcohol use: Yes    Alcohol/week: 0.0 standard drinks  . Drug use: Yes    Types: Marijuana  . Sexual activity: Not on file

## 2019-09-06 IMAGING — MR MR SHOULDER*R* W/O CM
5 series · 36 of 40 positions shown · non-contrast
Comparison: Right shoulder pain 01/01/2018.

CLINICAL DATA: Right shoulder pain since 12/26/2017. No recent
injury.

EXAM:
MRI OF THE RIGHT SHOULDER WITHOUT CONTRAST
TECHNIQUE: Multiplanar, multisequence MR imaging of the shoulder was performed.
No intravenous contrast was administered.

[Series 3: PD fat-sat · axial · 4.0mm · 0.55mm/px · z∈[+17,+106]mm · 8 of 20 slices shown (1 of 2)]
[im 1/20]
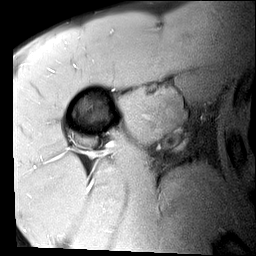
[im 3/20]
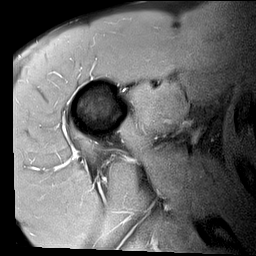
[im 6/20]
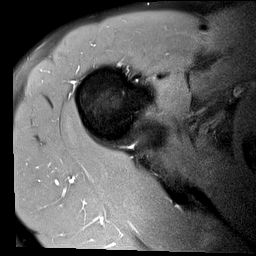
[im 9/20]
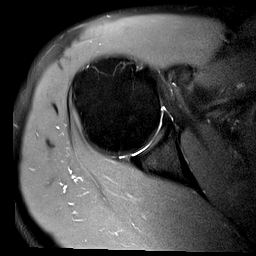
[im 11/20]
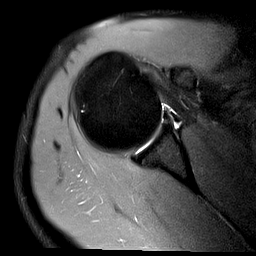
[im 14/20]
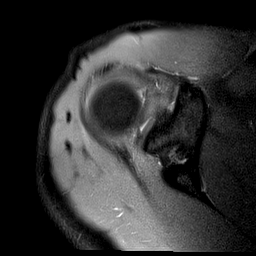
[im 17/20]
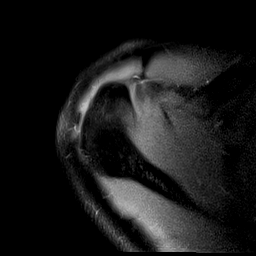
[im 20/20]
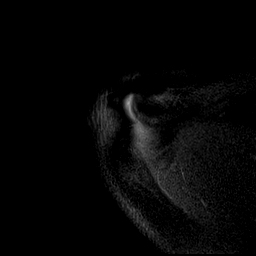

[Series 4: T2 fat-sat · oblique · 4.0mm · 0.62mm/px · 8 of 20 slices shown (1 of 2)]
[im 1/20]
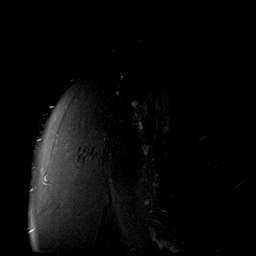
[im 3/20]
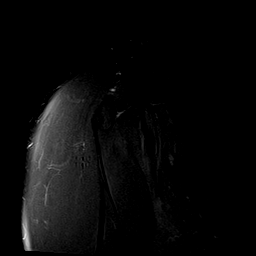
[im 6/20]
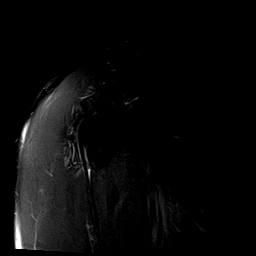
[im 9/20]
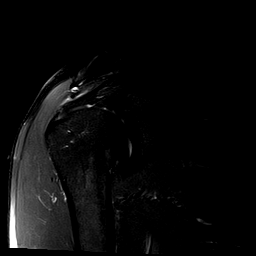
[im 11/20]
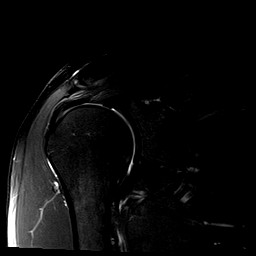
[im 14/20]
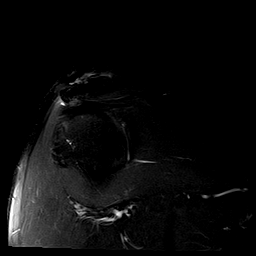
[im 17/20]
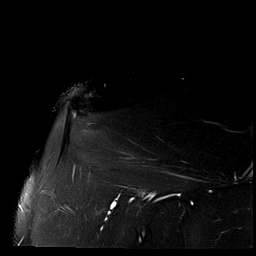
[im 20/20]
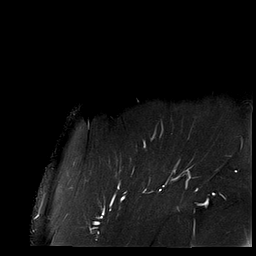

[Series 5: PD fat-sat · oblique · 4.0mm · 0.31mm/px · 8 of 20 slices shown (2 of 2)]
[im 1/20]
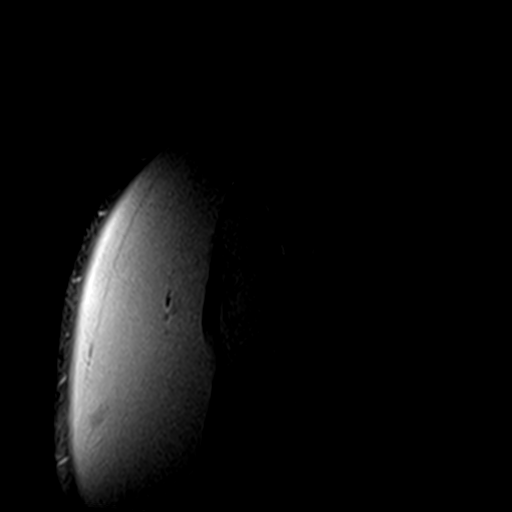
[im 3/20]
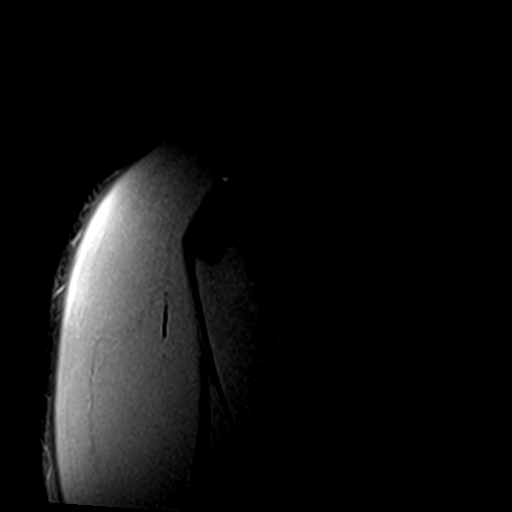
[im 6/20]
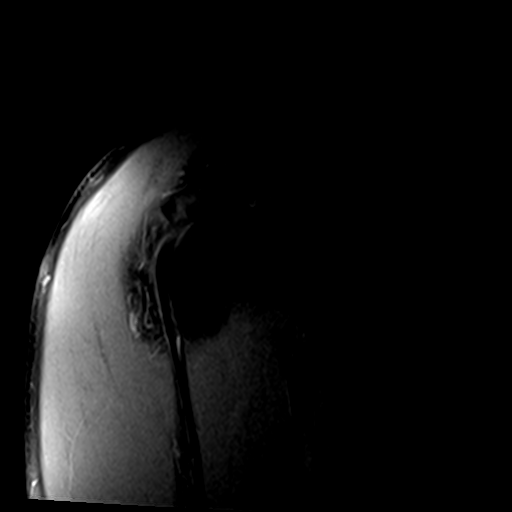
[im 9/20]
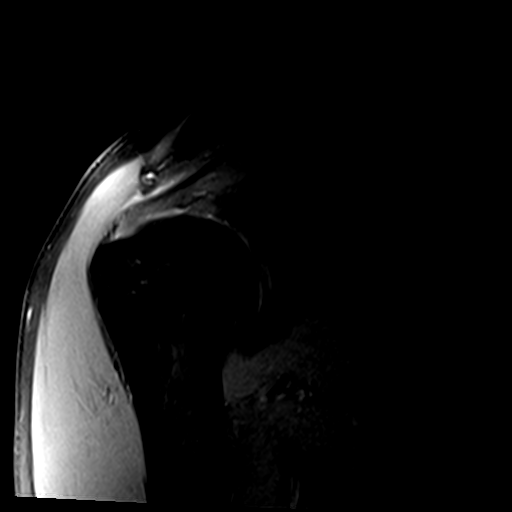
[im 11/20]
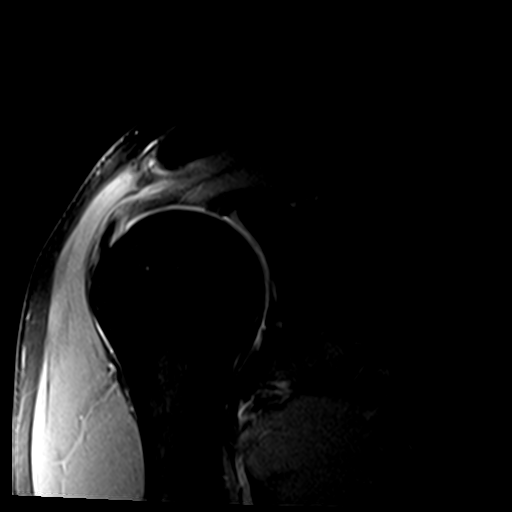
[im 14/20]
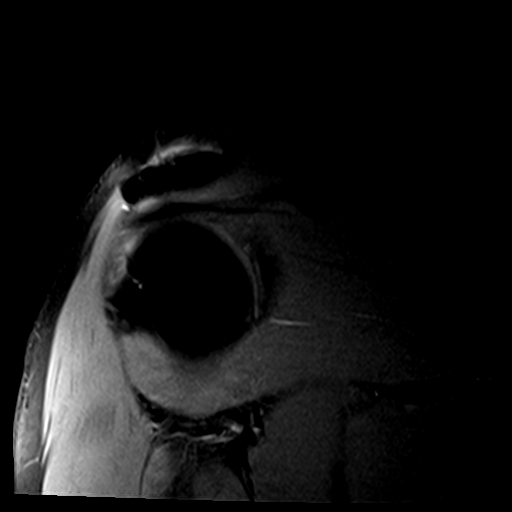
[im 17/20]
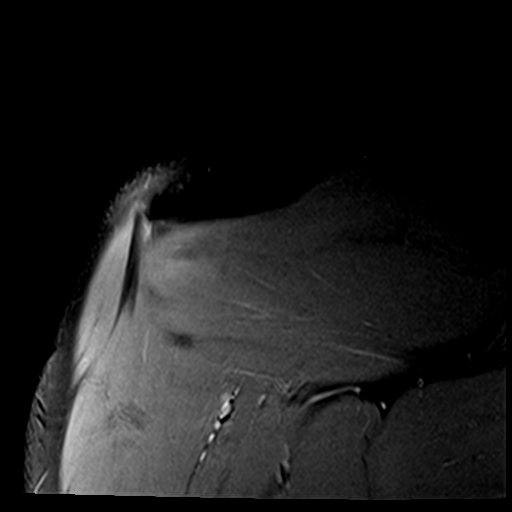
[im 20/20]
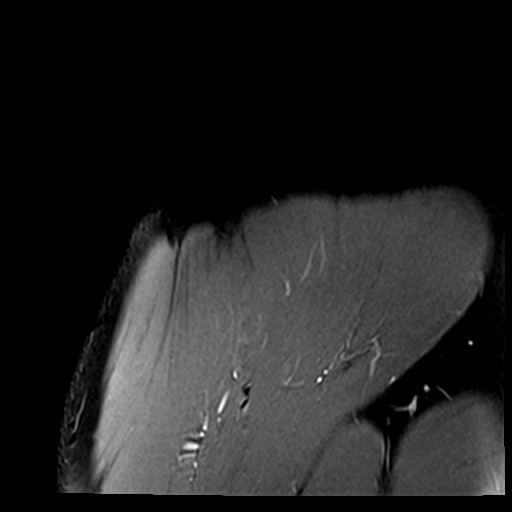

[Series 6: T1 · oblique · 4.0mm · 0.31mm/px · 4 of 22 slices shown]
[im 1/22]
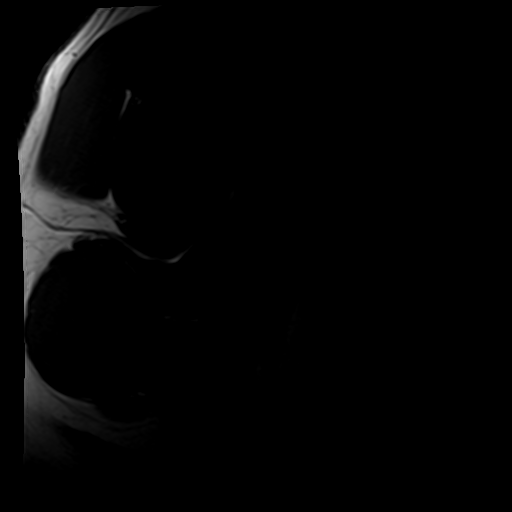
[im 4/22]
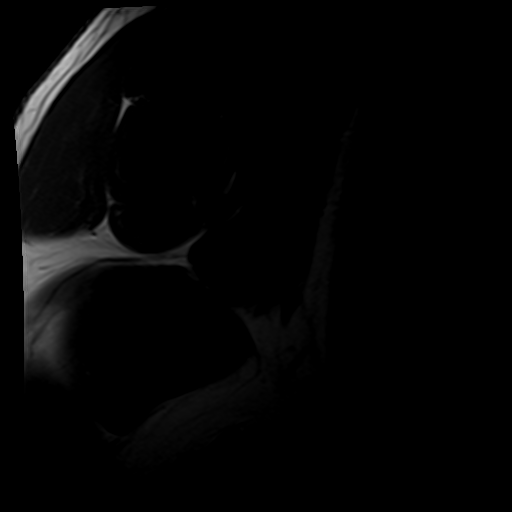
[im 7/22]
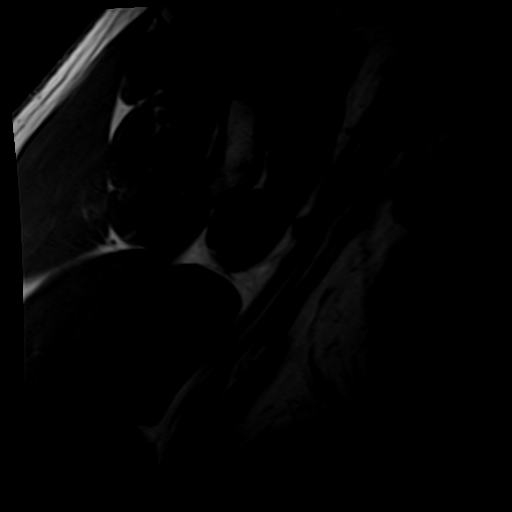
[im 10/22]
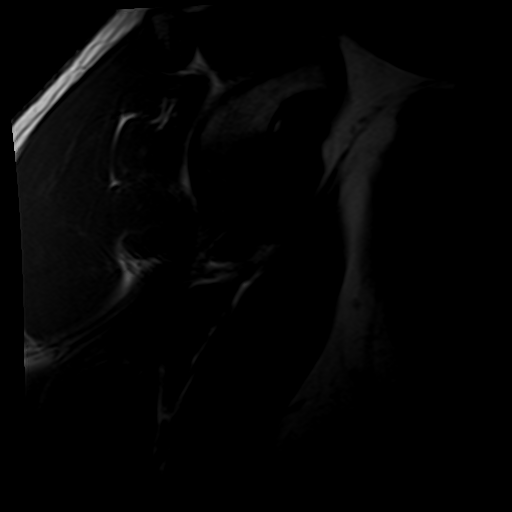

[Series 7: T2 fat-sat · oblique · 4.0mm · 0.62mm/px · 8 of 22 slices shown (2 of 2)]
[im 1/22]
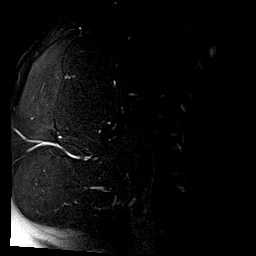
[im 4/22]
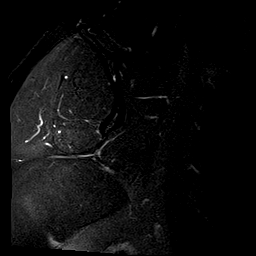
[im 7/22]
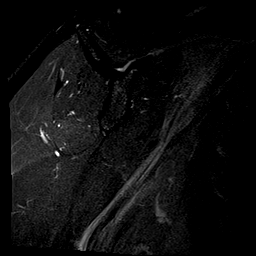
[im 10/22]
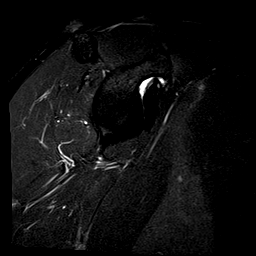
[im 13/22]
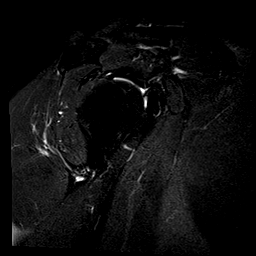
[im 16/22]
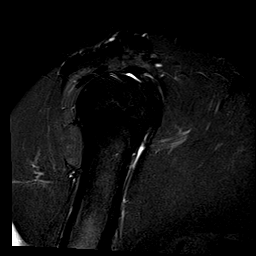
[im 19/22]
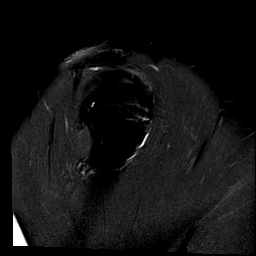
[im 22/22]
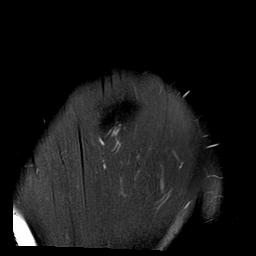

[36 of 40 positions shown; findings below may reference images not displayed]

FINDINGS: Rotator cuff: Mildly increased T2 signal in the supraspinatus and
infraspinatus tendons consistent with tendinopathy noted. No rotator
cuff tear.

Muscles:  Normal without atrophy or focal lesion.

Biceps long head:  Intact and normal in appearance.

Acromioclavicular Joint: Normal. Type II acromion. No
subacromial/subdeltoid bursal fluid.

Glenohumeral Joint: Normal.

Labrum:  Intact.

Bones:  Normal marrow signal throughout.

Other: None.
IMPRESSION: Mild supraspinatus and infraspinatus tendinopathy without tear. The
study is otherwise negative.

## 2020-09-11 ENCOUNTER — Other Ambulatory Visit: Payer: Self-pay

## 2020-09-11 ENCOUNTER — Emergency Department (HOSPITAL_COMMUNITY)
Admission: EM | Admit: 2020-09-11 | Discharge: 2020-09-11 | Disposition: A | Discharge status: Home / Self Care | Payer: BLUE CROSS/BLUE SHIELD | Attending: Emergency Medicine | Admitting: Emergency Medicine

## 2020-09-11 DIAGNOSIS — Z87891 Personal history of nicotine dependence: Secondary | ICD-10-CM | POA: Insufficient documentation

## 2020-09-11 DIAGNOSIS — Y92410 Unspecified street and highway as the place of occurrence of the external cause: Secondary | ICD-10-CM | POA: Insufficient documentation

## 2020-09-11 DIAGNOSIS — M25511 Pain in right shoulder: Secondary | ICD-10-CM | POA: Insufficient documentation

## 2020-09-11 DIAGNOSIS — M545 Low back pain, unspecified: Secondary | ICD-10-CM | POA: Insufficient documentation

## 2020-09-11 MED ORDER — METHOCARBAMOL 500 MG PO TABS: 500.0000 mg | ORAL_TABLET | Freq: Every evening | ORAL | 0 refills | Status: AC | PRN
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
## 2020-09-11 NOTE — Discharge Instructions (Addendum)
Take ibuprofen 3 times a day with meals.  Do not take other anti-inflammatories at the same time open (Advil, Motrin, naproxen, Aleve). You may supplement with Tylenol if you need further pain control. Use robaxin as needed for muscle stiffness or soreness.  Have caution, this may make you tired or groggy.  Do not drive or operate heavy machinery while taking this medicine. Use ice packs or heating pads if this helps control your pain. You will likely have continued muscle stiffness and soreness over the next couple days.  Follow-up with orthopedics in 1 week if your symptoms are not improving. Return to the emergency room if you develop vision changes, vomiting, slurred speech, numbness, loss of bowel or bladder control, or any new or worsening symptoms.
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
## 2020-09-21 ENCOUNTER — Ambulatory Visit: Payer: BLUE CROSS/BLUE SHIELD | Admitting: Orthopaedic Surgery

## 2020-09-28 ENCOUNTER — Ambulatory Visit: Payer: BLUE CROSS/BLUE SHIELD | Admitting: Orthopaedic Surgery
